# Patient Record
Sex: Female | Born: 1970 | Race: White | Hispanic: No | Marital: Married | Smoking: Former smoker
Health system: Southern US, Community
[De-identification: ages and names within clinical notes are randomized; demographics above are authoritative.]

## PROBLEM LIST (undated history)

## (undated) DIAGNOSIS — E039 Hypothyroidism, unspecified: Secondary | ICD-10-CM

## (undated) DIAGNOSIS — Z9889 Other specified postprocedural states: Secondary | ICD-10-CM

## (undated) DIAGNOSIS — R112 Nausea with vomiting, unspecified: Secondary | ICD-10-CM

## (undated) HISTORY — PX: OTHER SURGICAL HISTORY: SHX169

## (undated) HISTORY — PX: BREAST EXCISIONAL BIOPSY: SUR124

## (undated) HISTORY — PX: BREAST BIOPSY: SHX20

---

## 1997-09-05 ENCOUNTER — Other Ambulatory Visit: Admission: RE | Admit: 1997-09-05 | Discharge: 1997-09-05 | Payer: Self-pay | Admitting: Obstetrics and Gynecology

## 1998-03-21 ENCOUNTER — Inpatient Hospital Stay (HOSPITAL_COMMUNITY): Admission: AD | Admit: 1998-03-21 | Discharge: 1998-03-24 | Payer: Self-pay | Admitting: *Deleted

## 1998-05-01 ENCOUNTER — Other Ambulatory Visit: Admission: RE | Admit: 1998-05-01 | Discharge: 1998-05-01 | Payer: Self-pay | Admitting: Obstetrics and Gynecology

## 1999-04-26 ENCOUNTER — Other Ambulatory Visit: Admission: RE | Admit: 1999-04-26 | Discharge: 1999-04-26 | Payer: Self-pay | Admitting: Obstetrics and Gynecology

## 2003-10-05 ENCOUNTER — Other Ambulatory Visit: Admission: RE | Admit: 2003-10-05 | Discharge: 2003-10-05 | Payer: Self-pay | Admitting: Obstetrics and Gynecology

## 2004-10-17 ENCOUNTER — Other Ambulatory Visit: Admission: RE | Admit: 2004-10-17 | Discharge: 2004-10-17 | Payer: Self-pay | Admitting: Obstetrics and Gynecology

## 2004-11-25 ENCOUNTER — Other Ambulatory Visit: Admission: RE | Admit: 2004-11-25 | Discharge: 2004-11-25 | Payer: Self-pay | Admitting: Obstetrics and Gynecology

## 2005-01-28 ENCOUNTER — Other Ambulatory Visit: Admission: RE | Admit: 2005-01-28 | Discharge: 2005-01-28 | Payer: Self-pay | Admitting: Obstetrics and Gynecology

## 2005-08-06 ENCOUNTER — Other Ambulatory Visit: Admission: RE | Admit: 2005-08-06 | Discharge: 2005-08-06 | Payer: Self-pay | Admitting: Obstetrics and Gynecology

## 2005-11-13 ENCOUNTER — Other Ambulatory Visit: Admission: RE | Admit: 2005-11-13 | Discharge: 2005-11-13 | Payer: Self-pay | Admitting: Obstetrics and Gynecology

## 2006-12-23 ENCOUNTER — Other Ambulatory Visit: Admission: RE | Admit: 2006-12-23 | Discharge: 2006-12-23 | Payer: Self-pay | Admitting: Obstetrics and Gynecology

## 2007-12-27 ENCOUNTER — Other Ambulatory Visit: Admission: RE | Admit: 2007-12-27 | Discharge: 2007-12-27 | Payer: Self-pay | Admitting: Obstetrics and Gynecology

## 2008-12-27 ENCOUNTER — Other Ambulatory Visit: Admission: RE | Admit: 2008-12-27 | Discharge: 2008-12-27 | Payer: Self-pay | Admitting: Obstetrics and Gynecology

## 2009-12-03 ENCOUNTER — Other Ambulatory Visit: Admission: RE | Admit: 2009-12-03 | Discharge: 2009-12-03 | Payer: Self-pay | Admitting: Family Medicine

## 2010-03-11 ENCOUNTER — Ambulatory Visit (HOSPITAL_COMMUNITY)
Admission: RE | Admit: 2010-03-11 | Discharge: 2010-03-11 | Payer: Self-pay | Source: Home / Self Care | Attending: Family Medicine | Admitting: Family Medicine

## 2010-12-12 ENCOUNTER — Other Ambulatory Visit: Payer: Self-pay | Admitting: Family Medicine

## 2010-12-12 ENCOUNTER — Other Ambulatory Visit (HOSPITAL_COMMUNITY)
Admission: RE | Admit: 2010-12-12 | Discharge: 2010-12-12 | Disposition: A | Discharge status: Home / Self Care | Payer: BC Managed Care – PPO | Source: Ambulatory Visit | Attending: Family Medicine | Admitting: Family Medicine

## 2010-12-12 DIAGNOSIS — Z124 Encounter for screening for malignant neoplasm of cervix: Secondary | ICD-10-CM | POA: Insufficient documentation

## 2010-12-12 DIAGNOSIS — Z1159 Encounter for screening for other viral diseases: Secondary | ICD-10-CM | POA: Insufficient documentation

## 2011-03-18 ENCOUNTER — Other Ambulatory Visit (HOSPITAL_COMMUNITY): Payer: Self-pay | Admitting: Family Medicine

## 2011-03-18 DIAGNOSIS — Z1231 Encounter for screening mammogram for malignant neoplasm of breast: Secondary | ICD-10-CM

## 2011-03-20 ENCOUNTER — Ambulatory Visit (HOSPITAL_COMMUNITY)
Admission: RE | Admit: 2011-03-20 | Discharge: 2011-03-20 | Disposition: A | Discharge status: Home / Self Care | Payer: BC Managed Care – PPO | Source: Ambulatory Visit | Attending: Family Medicine | Admitting: Family Medicine

## 2011-03-20 DIAGNOSIS — Z1231 Encounter for screening mammogram for malignant neoplasm of breast: Secondary | ICD-10-CM

## 2012-03-16 ENCOUNTER — Other Ambulatory Visit (HOSPITAL_COMMUNITY): Payer: Self-pay

## 2012-03-16 DIAGNOSIS — Z1231 Encounter for screening mammogram for malignant neoplasm of breast: Secondary | ICD-10-CM

## 2012-03-25 ENCOUNTER — Ambulatory Visit (HOSPITAL_COMMUNITY)
Admission: RE | Admit: 2012-03-25 | Discharge: 2012-03-25 | Disposition: A | Discharge status: Home / Self Care | Payer: PRIVATE HEALTH INSURANCE | Source: Ambulatory Visit

## 2012-03-25 DIAGNOSIS — Z1231 Encounter for screening mammogram for malignant neoplasm of breast: Secondary | ICD-10-CM | POA: Insufficient documentation

## 2013-03-10 ENCOUNTER — Other Ambulatory Visit (HOSPITAL_COMMUNITY): Payer: Self-pay | Admitting: Family Medicine

## 2013-03-10 DIAGNOSIS — Z1231 Encounter for screening mammogram for malignant neoplasm of breast: Secondary | ICD-10-CM

## 2013-03-28 ENCOUNTER — Ambulatory Visit (HOSPITAL_COMMUNITY)
Admission: RE | Admit: 2013-03-28 | Discharge: 2013-03-28 | Disposition: A | Discharge status: Home / Self Care | Payer: PRIVATE HEALTH INSURANCE | Source: Ambulatory Visit | Attending: Family Medicine | Admitting: Family Medicine

## 2013-03-28 DIAGNOSIS — Z1231 Encounter for screening mammogram for malignant neoplasm of breast: Secondary | ICD-10-CM | POA: Insufficient documentation

## 2013-04-01 ENCOUNTER — Other Ambulatory Visit: Payer: Self-pay | Admitting: Family Medicine

## 2013-04-01 DIAGNOSIS — R928 Other abnormal and inconclusive findings on diagnostic imaging of breast: Secondary | ICD-10-CM

## 2013-04-12 ENCOUNTER — Ambulatory Visit
Admission: RE | Admit: 2013-04-12 | Discharge: 2013-04-12 | Disposition: A | Discharge status: Home / Self Care | Payer: PRIVATE HEALTH INSURANCE | Source: Ambulatory Visit | Attending: Family Medicine | Admitting: Family Medicine

## 2013-04-12 ENCOUNTER — Other Ambulatory Visit: Payer: Self-pay | Admitting: Family Medicine

## 2013-04-12 DIAGNOSIS — R928 Other abnormal and inconclusive findings on diagnostic imaging of breast: Secondary | ICD-10-CM

## 2013-09-12 ENCOUNTER — Other Ambulatory Visit: Payer: Self-pay | Admitting: Family Medicine

## 2013-09-12 DIAGNOSIS — R921 Mammographic calcification found on diagnostic imaging of breast: Secondary | ICD-10-CM

## 2013-10-12 ENCOUNTER — Encounter (INDEPENDENT_AMBULATORY_CARE_PROVIDER_SITE_OTHER): Payer: Self-pay

## 2013-10-12 ENCOUNTER — Ambulatory Visit
Admission: RE | Admit: 2013-10-12 | Discharge: 2013-10-12 | Disposition: A | Discharge status: Home / Self Care | Payer: PRIVATE HEALTH INSURANCE | Source: Ambulatory Visit | Attending: Family Medicine | Admitting: Family Medicine

## 2013-10-12 DIAGNOSIS — R921 Mammographic calcification found on diagnostic imaging of breast: Secondary | ICD-10-CM

## 2013-12-19 ENCOUNTER — Other Ambulatory Visit: Payer: Self-pay | Admitting: Family Medicine

## 2013-12-19 ENCOUNTER — Other Ambulatory Visit (HOSPITAL_COMMUNITY)
Admission: RE | Admit: 2013-12-19 | Discharge: 2013-12-19 | Disposition: A | Discharge status: Home / Self Care | Payer: PRIVATE HEALTH INSURANCE | Source: Ambulatory Visit | Attending: Family Medicine | Admitting: Family Medicine

## 2013-12-19 DIAGNOSIS — Z113 Encounter for screening for infections with a predominantly sexual mode of transmission: Secondary | ICD-10-CM | POA: Diagnosis present

## 2013-12-19 DIAGNOSIS — Z01411 Encounter for gynecological examination (general) (routine) with abnormal findings: Secondary | ICD-10-CM | POA: Diagnosis present

## 2013-12-20 LAB — CYTOLOGY - PAP

## 2014-03-08 ENCOUNTER — Other Ambulatory Visit: Payer: Self-pay | Admitting: Family Medicine

## 2014-03-08 DIAGNOSIS — R921 Mammographic calcification found on diagnostic imaging of breast: Secondary | ICD-10-CM

## 2014-03-17 ENCOUNTER — Ambulatory Visit
Admission: RE | Admit: 2014-03-17 | Discharge: 2014-03-17 | Disposition: A | Discharge status: Home / Self Care | Payer: PRIVATE HEALTH INSURANCE | Source: Ambulatory Visit | Attending: Family Medicine | Admitting: Family Medicine

## 2014-03-17 DIAGNOSIS — R921 Mammographic calcification found on diagnostic imaging of breast: Secondary | ICD-10-CM

## 2015-03-07 ENCOUNTER — Other Ambulatory Visit: Payer: Self-pay | Admitting: Family Medicine

## 2015-03-07 DIAGNOSIS — R921 Mammographic calcification found on diagnostic imaging of breast: Secondary | ICD-10-CM

## 2015-03-20 ENCOUNTER — Other Ambulatory Visit: Payer: Self-pay | Admitting: Family Medicine

## 2015-03-20 ENCOUNTER — Ambulatory Visit
Admission: RE | Admit: 2015-03-20 | Discharge: 2015-03-20 | Disposition: A | Discharge status: Home / Self Care | Payer: No Typology Code available for payment source | Source: Ambulatory Visit | Attending: Family Medicine | Admitting: Family Medicine

## 2015-03-20 DIAGNOSIS — R921 Mammographic calcification found on diagnostic imaging of breast: Secondary | ICD-10-CM

## 2016-03-06 ENCOUNTER — Other Ambulatory Visit: Payer: Self-pay | Admitting: Family Medicine

## 2016-03-06 DIAGNOSIS — R921 Mammographic calcification found on diagnostic imaging of breast: Secondary | ICD-10-CM

## 2016-03-28 ENCOUNTER — Ambulatory Visit
Admission: RE | Admit: 2016-03-28 | Discharge: 2016-03-28 | Disposition: A | Discharge status: Home / Self Care | Payer: No Typology Code available for payment source | Source: Ambulatory Visit | Attending: Family Medicine | Admitting: Family Medicine

## 2016-03-28 ENCOUNTER — Other Ambulatory Visit: Payer: Self-pay | Admitting: Family Medicine

## 2016-03-28 DIAGNOSIS — R921 Mammographic calcification found on diagnostic imaging of breast: Secondary | ICD-10-CM

## 2016-04-01 ENCOUNTER — Ambulatory Visit
Admission: RE | Admit: 2016-04-01 | Discharge: 2016-04-01 | Disposition: A | Discharge status: Home / Self Care | Payer: No Typology Code available for payment source | Source: Ambulatory Visit | Attending: Family Medicine | Admitting: Family Medicine

## 2016-04-01 DIAGNOSIS — R921 Mammographic calcification found on diagnostic imaging of breast: Secondary | ICD-10-CM

## 2016-04-08 ENCOUNTER — Other Ambulatory Visit: Payer: Self-pay | Admitting: General Surgery

## 2016-04-08 DIAGNOSIS — N6092 Unspecified benign mammary dysplasia of left breast: Secondary | ICD-10-CM

## 2016-04-25 ENCOUNTER — Other Ambulatory Visit: Payer: Self-pay | Admitting: General Surgery

## 2016-04-25 DIAGNOSIS — N6092 Unspecified benign mammary dysplasia of left breast: Secondary | ICD-10-CM

## 2016-04-28 ENCOUNTER — Encounter (HOSPITAL_BASED_OUTPATIENT_CLINIC_OR_DEPARTMENT_OTHER): Payer: Self-pay | Admitting: *Deleted

## 2016-04-28 NOTE — Progress Notes (Signed)
Bring all medications day of surgery. Coming to pick up Boost Monday.

## 2016-05-05 ENCOUNTER — Ambulatory Visit
Admission: RE | Admit: 2016-05-05 | Discharge: 2016-05-05 | Disposition: A | Discharge status: Home / Self Care | Payer: No Typology Code available for payment source | Source: Ambulatory Visit | Attending: General Surgery | Admitting: General Surgery

## 2016-05-05 DIAGNOSIS — N6092 Unspecified benign mammary dysplasia of left breast: Secondary | ICD-10-CM

## 2016-05-05 NOTE — Progress Notes (Signed)
Boost drink given with instructions to complete by 0715/ pt verbalized understanding.

## 2016-05-06 ENCOUNTER — Ambulatory Visit (HOSPITAL_BASED_OUTPATIENT_CLINIC_OR_DEPARTMENT_OTHER): Payer: No Typology Code available for payment source | Admitting: Anesthesiology

## 2016-05-06 ENCOUNTER — Encounter (HOSPITAL_BASED_OUTPATIENT_CLINIC_OR_DEPARTMENT_OTHER): Payer: Self-pay | Admitting: Anesthesiology

## 2016-05-06 ENCOUNTER — Encounter (HOSPITAL_BASED_OUTPATIENT_CLINIC_OR_DEPARTMENT_OTHER)
Admission: RE | Disposition: A | Discharge status: Home / Self Care | Payer: Self-pay | Source: Ambulatory Visit | Attending: General Surgery

## 2016-05-06 ENCOUNTER — Ambulatory Visit (HOSPITAL_BASED_OUTPATIENT_CLINIC_OR_DEPARTMENT_OTHER)
Admission: RE | Admit: 2016-05-06 | Discharge: 2016-05-06 | Disposition: A | Discharge status: Home / Self Care | Payer: No Typology Code available for payment source | Source: Ambulatory Visit | Attending: General Surgery | Admitting: General Surgery

## 2016-05-06 ENCOUNTER — Ambulatory Visit
Admission: RE | Admit: 2016-05-06 | Discharge: 2016-05-06 | Disposition: A | Discharge status: Home / Self Care | Payer: No Typology Code available for payment source | Source: Ambulatory Visit | Attending: General Surgery | Admitting: General Surgery

## 2016-05-06 DIAGNOSIS — N6092 Unspecified benign mammary dysplasia of left breast: Secondary | ICD-10-CM

## 2016-05-06 DIAGNOSIS — Z87891 Personal history of nicotine dependence: Secondary | ICD-10-CM | POA: Insufficient documentation

## 2016-05-06 DIAGNOSIS — Z79899 Other long term (current) drug therapy: Secondary | ICD-10-CM | POA: Insufficient documentation

## 2016-05-06 HISTORY — DX: Other specified postprocedural states: Z98.890

## 2016-05-06 HISTORY — PX: RADIOACTIVE SEED GUIDED EXCISIONAL BREAST BIOPSY: SHX6490

## 2016-05-06 HISTORY — DX: Other specified postprocedural states: R11.2

## 2016-05-06 HISTORY — DX: Hypothyroidism, unspecified: E03.9

## 2016-05-06 SURGERY — RADIOACTIVE SEED GUIDED BREAST BIOPSY
Anesthesia: General | Site: Breast | Laterality: Left

## 2016-05-06 MED ORDER — SCOPOLAMINE 1 MG/3DAYS TD PT72
1.0000 | MEDICATED_PATCH | Freq: Once | TRANSDERMAL | Status: DC | PRN
  Administered 2016-05-06: 1.5 mg via TRANSDERMAL

## 2016-05-06 MED ORDER — EPHEDRINE 5 MG/ML INJ
INTRAVENOUS | Status: AC
  Filled 2016-05-06: qty 10

## 2016-05-06 MED ORDER — PHENYLEPHRINE HCL 10 MG/ML IJ SOLN
INTRAMUSCULAR | Status: DC | PRN
  Administered 2016-05-06 (×6): 80 ug via INTRAVENOUS

## 2016-05-06 MED ORDER — CEFAZOLIN SODIUM-DEXTROSE 2-4 GM/100ML-% IV SOLN
2.0000 g | INTRAVENOUS | Status: AC
  Administered 2016-05-06: 2 g via INTRAVENOUS

## 2016-05-06 MED ORDER — ACETAMINOPHEN 500 MG PO TABS
ORAL_TABLET | ORAL | Status: AC
  Filled 2016-05-06: qty 2

## 2016-05-06 MED ORDER — LIDOCAINE 2% (20 MG/ML) 5 ML SYRINGE
INTRAMUSCULAR | Status: DC | PRN
  Administered 2016-05-06: 100 mg via INTRAVENOUS

## 2016-05-06 MED ORDER — HYDROCODONE-ACETAMINOPHEN 10-325 MG PO TABS: 1.0000 | ORAL_TABLET | Freq: Four times a day (QID) | ORAL | 0 refills | Status: DC | PRN

## 2016-05-06 MED ORDER — ONDANSETRON HCL 4 MG/2ML IJ SOLN
INTRAMUSCULAR | Status: AC
  Filled 2016-05-06: qty 2

## 2016-05-06 MED ORDER — PHENYLEPHRINE 40 MCG/ML (10ML) SYRINGE FOR IV PUSH (FOR BLOOD PRESSURE SUPPORT)
PREFILLED_SYRINGE | INTRAVENOUS | Status: AC
  Filled 2016-05-06: qty 10

## 2016-05-06 MED ORDER — MIDAZOLAM HCL 2 MG/2ML IJ SOLN
INTRAMUSCULAR | Status: AC
  Filled 2016-05-06: qty 2

## 2016-05-06 MED ORDER — MIDAZOLAM HCL 2 MG/2ML IJ SOLN
1.0000 mg | INTRAMUSCULAR | Status: DC | PRN
  Administered 2016-05-06: 2 mg via INTRAVENOUS

## 2016-05-06 MED ORDER — LIDOCAINE 2% (20 MG/ML) 5 ML SYRINGE
INTRAMUSCULAR | Status: AC
  Filled 2016-05-06: qty 5

## 2016-05-06 MED ORDER — FENTANYL CITRATE (PF) 100 MCG/2ML IJ SOLN
50.0000 ug | INTRAMUSCULAR | Status: DC | PRN
  Administered 2016-05-06: 100 ug via INTRAVENOUS

## 2016-05-06 MED ORDER — GABAPENTIN 300 MG PO CAPS
300.0000 mg | ORAL_CAPSULE | ORAL | Status: AC
  Administered 2016-05-06: 300 mg via ORAL

## 2016-05-06 MED ORDER — ONDANSETRON HCL 4 MG/2ML IJ SOLN
INTRAMUSCULAR | Status: DC | PRN
  Administered 2016-05-06: 4 mg via INTRAVENOUS

## 2016-05-06 MED ORDER — BUPIVACAINE HCL (PF) 0.25 % IJ SOLN
INTRAMUSCULAR | Status: DC | PRN
  Administered 2016-05-06: 6 mL

## 2016-05-06 MED ORDER — GLYCOPYRROLATE 0.2 MG/ML IJ SOLN
INTRAMUSCULAR | Status: DC | PRN
  Administered 2016-05-06: 0.2 mg via INTRAVENOUS

## 2016-05-06 MED ORDER — GABAPENTIN 300 MG PO CAPS
ORAL_CAPSULE | ORAL | Status: AC
  Filled 2016-05-06: qty 1

## 2016-05-06 MED ORDER — SCOPOLAMINE 1 MG/3DAYS TD PT72
MEDICATED_PATCH | TRANSDERMAL | Status: AC
  Filled 2016-05-06: qty 1

## 2016-05-06 MED ORDER — CELECOXIB 200 MG PO CAPS
200.0000 mg | ORAL_CAPSULE | ORAL | Status: AC
  Administered 2016-05-06: 200 mg via ORAL

## 2016-05-06 MED ORDER — PROPOFOL 10 MG/ML IV BOLUS
INTRAVENOUS | Status: AC
  Filled 2016-05-06: qty 20

## 2016-05-06 MED ORDER — DEXAMETHASONE SODIUM PHOSPHATE 10 MG/ML IJ SOLN
INTRAMUSCULAR | Status: AC
  Filled 2016-05-06: qty 1

## 2016-05-06 MED ORDER — FENTANYL CITRATE (PF) 100 MCG/2ML IJ SOLN
INTRAMUSCULAR | Status: AC
  Filled 2016-05-06: qty 2

## 2016-05-06 MED ORDER — LACTATED RINGERS IV SOLN
INTRAVENOUS | Status: DC
  Administered 2016-05-06 (×2): via INTRAVENOUS

## 2016-05-06 MED ORDER — DEXAMETHASONE SODIUM PHOSPHATE 4 MG/ML IJ SOLN
INTRAMUSCULAR | Status: DC | PRN
  Administered 2016-05-06: 10 mg via INTRAVENOUS

## 2016-05-06 MED ORDER — CEFAZOLIN SODIUM-DEXTROSE 2-4 GM/100ML-% IV SOLN
INTRAVENOUS | Status: AC
  Filled 2016-05-06: qty 100

## 2016-05-06 MED ORDER — PROPOFOL 10 MG/ML IV BOLUS
INTRAVENOUS | Status: DC | PRN
  Administered 2016-05-06: 200 mg via INTRAVENOUS

## 2016-05-06 MED ORDER — ACETAMINOPHEN 500 MG PO TABS
1000.0000 mg | ORAL_TABLET | ORAL | Status: AC
  Administered 2016-05-06: 1000 mg via ORAL

## 2016-05-06 MED ORDER — CELECOXIB 200 MG PO CAPS
ORAL_CAPSULE | ORAL | Status: AC
  Filled 2016-05-06: qty 1

## 2016-05-06 SURGICAL SUPPLY — 57 items
ADH SKN CLS APL DERMABOND .7 (GAUZE/BANDAGES/DRESSINGS) ×1
BINDER BREAST LRG (GAUZE/BANDAGES/DRESSINGS) IMPLANT
BINDER BREAST MEDIUM (GAUZE/BANDAGES/DRESSINGS) ×1 IMPLANT
BINDER BREAST XLRG (GAUZE/BANDAGES/DRESSINGS) IMPLANT
BINDER BREAST XXLRG (GAUZE/BANDAGES/DRESSINGS) IMPLANT
BLADE SURG 15 STRL LF DISP TIS (BLADE) ×1 IMPLANT
BLADE SURG 15 STRL SS (BLADE) ×2
CANISTER SUC SOCK COL 7IN (MISCELLANEOUS) IMPLANT
CANISTER SUCT 1200ML W/VALVE (MISCELLANEOUS) IMPLANT
CHLORAPREP W/TINT 26ML (MISCELLANEOUS) ×2 IMPLANT
CLIP TI WIDE RED SMALL 6 (CLIP) IMPLANT
COVER BACK TABLE 60X90IN (DRAPES) ×2 IMPLANT
COVER MAYO STAND STRL (DRAPES) ×2 IMPLANT
COVER PROBE W GEL 5X96 (DRAPES) ×2 IMPLANT
DECANTER SPIKE VIAL GLASS SM (MISCELLANEOUS) IMPLANT
DERMABOND ADVANCED (GAUZE/BANDAGES/DRESSINGS) ×1
DERMABOND ADVANCED .7 DNX12 (GAUZE/BANDAGES/DRESSINGS) ×1 IMPLANT
DEVICE DUBIN W/COMP PLATE 8390 (MISCELLANEOUS) ×2 IMPLANT
DRAPE LAPAROSCOPIC ABDOMINAL (DRAPES) ×2 IMPLANT
DRAPE UTILITY XL STRL (DRAPES) ×2 IMPLANT
DRSG TEGADERM 4X4.75 (GAUZE/BANDAGES/DRESSINGS) IMPLANT
ELECT COATED BLADE 2.86 ST (ELECTRODE) ×2 IMPLANT
ELECT REM PT RETURN 9FT ADLT (ELECTROSURGICAL) ×2
ELECTRODE REM PT RTRN 9FT ADLT (ELECTROSURGICAL) ×1 IMPLANT
GLOVE BIO SURGEON STRL SZ7 (GLOVE) ×5 IMPLANT
GLOVE BIOGEL PI IND STRL 7.0 (GLOVE) IMPLANT
GLOVE BIOGEL PI IND STRL 7.5 (GLOVE) ×1 IMPLANT
GLOVE BIOGEL PI INDICATOR 7.0 (GLOVE) ×1
GLOVE BIOGEL PI INDICATOR 7.5 (GLOVE) ×2
GOWN STRL REUS W/ TWL LRG LVL3 (GOWN DISPOSABLE) ×2 IMPLANT
GOWN STRL REUS W/TWL LRG LVL3 (GOWN DISPOSABLE) ×4
HEMOSTAT ARISTA ABSORB 3G PWDR (MISCELLANEOUS) IMPLANT
ILLUMINATOR WAVEGUIDE N/F (MISCELLANEOUS) IMPLANT
KIT MARKER MARGIN INK (KITS) ×2 IMPLANT
LIGHT WAVEGUIDE WIDE FLAT (MISCELLANEOUS) IMPLANT
NDL HYPO 25X1 1.5 SAFETY (NEEDLE) ×1 IMPLANT
NEEDLE HYPO 25X1 1.5 SAFETY (NEEDLE) ×2 IMPLANT
NS IRRIG 1000ML POUR BTL (IV SOLUTION) IMPLANT
PACK BASIN DAY SURGERY FS (CUSTOM PROCEDURE TRAY) ×2 IMPLANT
PENCIL BUTTON HOLSTER BLD 10FT (ELECTRODE) ×2 IMPLANT
SLEEVE SCD COMPRESS KNEE MED (MISCELLANEOUS) ×2 IMPLANT
SPONGE GAUZE 4X4 12PLY STER LF (GAUZE/BANDAGES/DRESSINGS) IMPLANT
SPONGE LAP 4X18 X RAY DECT (DISPOSABLE) ×2 IMPLANT
STRIP CLOSURE SKIN 1/2X4 (GAUZE/BANDAGES/DRESSINGS) ×2 IMPLANT
SUT MNCRL AB 4-0 PS2 18 (SUTURE) IMPLANT
SUT MON AB 5-0 PS2 18 (SUTURE) ×1 IMPLANT
SUT SILK 2 0 SH (SUTURE) IMPLANT
SUT VIC AB 2-0 SH 27 (SUTURE) ×2
SUT VIC AB 2-0 SH 27XBRD (SUTURE) ×1 IMPLANT
SUT VIC AB 3-0 SH 27 (SUTURE) ×2
SUT VIC AB 3-0 SH 27X BRD (SUTURE) ×1 IMPLANT
SUT VIC AB 5-0 PS2 18 (SUTURE) IMPLANT
SYR CONTROL 10ML LL (SYRINGE) ×2 IMPLANT
TOWEL OR 17X24 6PK STRL BLUE (TOWEL DISPOSABLE) ×2 IMPLANT
TOWEL OR NON WOVEN STRL DISP B (DISPOSABLE) ×1 IMPLANT
TUBE CONNECTING 20X1/4 (TUBING) IMPLANT
YANKAUER SUCT BULB TIP NO VENT (SUCTIONS) IMPLANT

## 2016-05-06 NOTE — H&P (Signed)
  Donna Patel referred by Dr Farris HasAaron Morrow for left breat calcs on screening mm. she has no personal history or family history she did not have mass or dc. mm shows c density breasts. there is a group of coarse calcs in the luoq. this underwent biopsy that shows ALH. she is here to discuss options.   Past Surgical History  Cesarean Section - Multiple   Diagnostic Studies History  Colonoscopy  never Mammogram  within last year Pap Smear  1-5 years ago  Allergies  No Known Drug Allergies   Medication History Levothyroxine Sodium (100MCG Tablet, Oral) Active. Medications Reconciled  Social History  Alcohol use  Occasional alcohol use. Caffeine use  Coffee. No drug use  Tobacco use  Former smoker.  Family History  Diabetes Mellitus  Brother.  Pregnancy / Birth History Age at menarche  11 years. Contraceptive History  Oral contraceptives. Gravida  2 Length (months) of breastfeeding  3-6 Maternal age  47-30 Para  2 Regular periods   Other Problems  Thyroid Disease  Umbilical Hernia Repair   Review of Systems General Not Present- Appetite Loss, Chills, Fatigue, Fever, Night Sweats, Weight Gain and Weight Loss. Skin Not Present- Change in Wart/Mole, Dryness, Hives, Jaundice, New Lesions, Non-Healing Wounds, Rash and Ulcer. HEENT Present- Wears glasses/contact lenses. Not Present- Earache, Hearing Loss, Hoarseness, Nose Bleed, Oral Ulcers, Ringing in the Ears, Seasonal Allergies, Sinus Pain, Sore Throat, Visual Disturbances and Yellow Eyes. Respiratory Not Present- Bloody sputum, Chronic Cough, Difficulty Breathing, Snoring and Wheezing. Breast Not Present- Breast Mass, Breast Pain, Nipple Discharge and Skin Changes. Cardiovascular Not Present- Chest Pain, Difficulty Breathing Lying Down, Leg Cramps, Palpitations, Rapid Heart Rate, Shortness of Breath and Swelling of Extremities. Gastrointestinal Not Present- Abdominal Pain, Bloating, Bloody Stool, Change in  Bowel Habits, Chronic diarrhea, Constipation, Difficulty Swallowing, Excessive gas, Gets full quickly at meals, Hemorrhoids, Indigestion, Nausea, Rectal Pain and Vomiting. Female Genitourinary Not Present- Frequency, Nocturia, Painful Urination, Pelvic Pain and Urgency. Neurological Not Present- Decreased Memory, Fainting, Headaches, Numbness, Seizures, Tingling, Tremor, Trouble walking and Weakness. Endocrine Not Present- Cold Intolerance, Excessive Hunger, Hair Changes, Heat Intolerance, Hot flashes and New Diabetes. Hematology Not Present- Blood Thinners, Easy Bruising, Excessive bleeding, Gland problems, HIV and Persistent Infections.  Vitals  Weight: 112.13 lb Height: 60in Body Surface Area: 1.46 m Body Mass Index: 21.9 kg/m  Pulse: 72 (Regular)  BP: 126/78 (Sitting, Left Arm, Standard) Physical Exam General Mental Status-Alert. Orientation-Oriented X3. Chest and Lung Exam Chest and lung exam reveals -on auscultation, normal breath sounds, no adventitious sounds and normal vocal resonance. Breast Nipples-No Discharge. Breast Lump-No Palpable Breast Mass. Cardiovascular Cardiovascular examination reveals -normal heart sounds, regular rate and rhythm with no murmurs. Lymphatic Head & Neck General Head & Neck Lymphatics: Bilateral - Description - Normal. Axillary General Axillary Region: Bilateral - Description - Normal. 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 discussed rationale for excision given possibility of upgrade that might change way she is followed, chemoprevention or even be therapeutic. we discussed seed guided excision that would wait a couple weeks to have her hematoma resolve. discussed risks and recovery

## 2016-05-06 NOTE — Anesthesia Procedure Notes (Signed)
Procedure Name: LMA Insertion Date/Time: 05/06/2016 11:50 AM Performed by: Caren MacadamARTER, APRIL W Pre-anesthesia Checklist: Patient identified, Emergency Drugs available, Suction available and Patient being monitored Patient Re-evaluated:Patient Re-evaluated prior to inductionOxygen Delivery Method: Circle system utilized Preoxygenation: Pre-oxygenation with 100% oxygen Intubation Type: IV induction Ventilation: Mask ventilation without difficulty LMA: LMA inserted LMA Size: 3.0 Number of attempts: 1 Airway Equipment and Method: Bite block Placement Confirmation: positive ETCO2 and breath sounds checked- equal and bilateral Tube secured with: Tape Dental Injury: Teeth and Oropharynx as per pre-operative assessment

## 2016-05-06 NOTE — Op Note (Signed)
Preoperative diagnoses: left breast calcifications with core biopsy c/w alh Postoperative diagnosis: Same as above Procedure:Leftbreast seed guided excisional biopsy Surgeon: Dr. Harden MoMatt Deshana Rominger Anesthesia: Gen. Estimated blood loss: minimal Complications: None Drains: None Specimens:Leftbreast tissue with paint Sponge and needle count correct at completion Disposition to recovery stable  Indications: This is a [age] yof with left sided calcifications with core biospy with alh. We discussed excisional biopsy using seed guidance. She had seed placed prior to beginning and the mammogramswere in the room.   Procedure: After informed consent was obtained she was then taken to the operating room. She was given cefazolin. Sequential compression devices were on her legs. She was placed under general anesthesia without complication. Her left breast was then prepped and draped in the standard sterile surgical fashion. A surgical timeout was then performed.  I located the radioactive seed with the neoprobe. I infiltrated marcaine in the area of the seed. I made a periareolar incision to hide the scar. I then used the neoprobe to guide the excision of the seed and surrounding tissue.This was confirmed by the neoprobe. This was then taken for mammogram which confirmed removal of the seed and the clip. This was confirmed by radiology. This was then sent to pathology. Hemostasis was observed.I closed the breast tissue with a 2-0 Vicryl. The dermis was closed with 3-0 Vicryl and the skin with 5-0 Monocryl.Dermabond and steristrips were placed on the incision. A breast binder was placed. She was transferred to recovery stable

## 2016-05-06 NOTE — Anesthesia Postprocedure Evaluation (Signed)
Anesthesia Post Note  Patient: Donna Patel  Procedure(s) Performed: Procedure(s) (LRB): LEFT BREAST RADIOACTIVE SEED GUIDED EXCISIONAL BIOPSY (Left)  Patient location during evaluation: PACU Anesthesia Type: General Level of consciousness: sedated and patient cooperative Pain management: pain level controlled Vital Signs Assessment: post-procedure vital signs reviewed and stable Respiratory status: spontaneous breathing Cardiovascular status: stable Anesthetic complications: no       Last Vitals:  Vitals:   05/06/16 1315 05/06/16 1338  BP: 117/80 132/74  Pulse: 74 70  Resp: 14 18  Temp:  36.8 C    Last Pain:  Vitals:   05/06/16 1338  TempSrc:   PainSc: 2                  Lewie LoronJohn Moody Robben

## 2016-05-06 NOTE — Transfer of Care (Signed)
Immediate Anesthesia Transfer of Care Note  Patient: Donna Patel  Procedure(s) Performed: Procedure(s): LEFT BREAST RADIOACTIVE SEED GUIDED EXCISIONAL BIOPSY (Left)  Patient Location: PACU  Anesthesia Type:General  Level of Consciousness: sedated  Airway & Oxygen Therapy: Patient Spontanous Breathing and Patient connected to face mask oxygen  Post-op Assessment: Report given to RN and Post -op Vital signs reviewed and stable  Post vital signs: Reviewed and stable  Last Vitals:  Vitals:   05/06/16 1240 05/06/16 1241  BP: (!) 89/59   Pulse: 69 68  Resp: 16 11  Temp:      Last Pain:  Vitals:   05/06/16 1001  TempSrc: Oral         Complications: No apparent anesthesia complications

## 2016-05-06 NOTE — Discharge Instructions (Signed)
[facility] °Office Phone Number [phone] ° °POST OP INSTRUCTIONS ° °Always review your discharge instruction sheet given to you by the facility where your surgery was performed. ° °IF YOU HAVE DISABILITY OR FAMILY LEAVE FORMS, YOU MUST BRING THEM TO THE OFFICE FOR PROCESSING.  DO NOT GIVE THEM TO YOUR DOCTOR. ° °1. A prescription for pain medication may be given to you upon discharge.  Take your pain medication as prescribed, if needed.  If narcotic pain medicine is not needed, then you may take acetaminophen (Tylenol), naprosyn (Alleve) or ibuprofen (Advil) as needed. °2. Take your usually prescribed medications unless otherwise directed °3. If you need a refill on your pain medication, please contact your pharmacy.  They will contact our office to request authorization.  Prescriptions will not be filled after 5pm or on week-ends. °4. You should eat very light the first 24 hours after surgery, such as soup, crackers, pudding, etc.  Resume your normal diet the day after surgery. °5. Most patients will experience some swelling and bruising in the breast.  Ice packs and a good support bra will help.  Wear the breast binder provided or a sports bra for 72 hours day and night.  After that wear a sports bra during the day until you return to the office. Swelling and bruising can take several days to resolve.  °6. It is common to experience some constipation if taking pain medication after surgery.  Increasing fluid intake and taking a stool softener will usually help or prevent this problem from occurring.  A mild laxative (Milk of Magnesia or Miralax) should be taken according to package directions if there are no bowel movements after 48 hours. °7. Unless discharge instructions indicate otherwise, you may remove your bandages 48 hours after surgery and you may shower at that time.  You may have steri-strips (small skin tapes) in place directly over the incision.  These strips should be left on the  skin for 7-10 days and will come off on their own.  If your surgeon used skin glue on the incision, you may shower in 24 hours.  The glue will flake off over the next 2-3 weeks.  Any sutures or staples will be removed at the office during your follow-up visit. °8. ACTIVITIES:  You may resume regular daily activities (gradually increasing) beginning the next day.  Wearing a good support bra or sports bra minimizes pain and swelling.  You may have sexual intercourse when it is comfortable. °a. You may drive when you no longer are taking prescription pain medication, you can comfortably wear a seatbelt, and you can safely maneuver your car and apply brakes. °b. RETURN TO WORK:  ______________________________________________________________________________________ °9. You should see your doctor in the office for a follow-up appointment approximately two weeks after your surgery.  Your doctor’s nurse will typically make your follow-up appointment when she calls you with your pathology report.  Expect your pathology report 3-4 business days after your surgery.  You may call to check if you do not hear from us after three days. °10. OTHER INSTRUCTIONS: _______________________________________________________________________________________________ _____________________________________________________________________________________________________________________________________ °_____________________________________________________________________________________________________________________________________ °_____________________________________________________________________________________________________________________________________ ° °WHEN TO CALL DR WAKEFIELD: °1. Fever over 101.0 °2. Nausea and/or vomiting. °3. Extreme swelling or bruising. °4. Continued bleeding from incision. °5. Increased pain, redness, or drainage from the incision. ° °The clinic staff is available to answer your questions during regular  business hours.  Please don’t hesitate to call and ask to speak to one of the nurses for clinical concerns.  If   you have a medical emergency, go to the nearest emergency room or call 911.  A surgeon from [facility] is always on call at the hospital. ° °For further questions, please visit [email or web address] ° ° ° °Post Anesthesia Home Care Instructions ° °Activity: °Get plenty of rest for the remainder of the day. A responsible adult should stay with you for 24 hours following the procedure.  °For the next 24 hours, DO NOT: °-Drive a car °-Operate machinery °-Drink alcoholic beverages °-Take any medication unless instructed by your physician °-Make any legal decisions or sign important papers. ° °Meals: °Start with liquid foods such as gelatin or soup. Progress to regular foods as tolerated. Avoid greasy, spicy, heavy foods. If nausea and/or vomiting occur, drink only clear liquids until the nausea and/or vomiting subsides. Call your physician if vomiting continues. ° °Special Instructions/Symptoms: °Your throat may feel dry or sore from the anesthesia or the breathing tube placed in your throat during surgery. If this causes discomfort, gargle with warm salt water. The discomfort should disappear within 24 hours. ° °If you had a scopolamine patch placed behind your ear for the management of post- operative nausea and/or vomiting: ° °1. The medication in the patch is effective for 72 hours, after which it should be removed.  Wrap patch in a tissue and discard in the trash. Wash hands thoroughly with soap and water. °2. You may remove the patch earlier than 72 hours if you experience unpleasant side effects which may include dry mouth, dizziness or visual disturbances. °3. Avoid touching the patch. Wash your hands with soap and water after contact with the patch. °  ° °

## 2016-05-06 NOTE — Anesthesia Preprocedure Evaluation (Signed)
Anesthesia Evaluation  Patient identified by MRN, date of birth, ID band Patient awake    Reviewed: Allergy & Precautions, NPO status , Patient's Chart, lab work & pertinent test results  History of Anesthesia Complications (+) PONV and history of anesthetic complications  Airway Mallampati: II  TM Distance: >3 FB Neck ROM: Full    Dental no notable dental hx.    Pulmonary former smoker,    Pulmonary exam normal breath sounds clear to auscultation       Cardiovascular negative cardio ROS Normal cardiovascular exam Rhythm:Regular Rate:Normal     Neuro/Psych negative neurological ROS  negative psych ROS   GI/Hepatic negative GI ROS, Neg liver ROS,   Endo/Other  negative endocrine ROS  Renal/GU negative Renal ROS     Musculoskeletal negative musculoskeletal ROS (+)   Abdominal   Peds  Hematology negative hematology ROS (+)   Anesthesia Other Findings   Reproductive/Obstetrics                             Anesthesia Physical Anesthesia Plan  ASA: II  Anesthesia Plan: General   Post-op Pain Management:    Induction: Intravenous  Airway Management Planned: LMA  Additional Equipment:   Intra-op Plan:   Post-operative Plan: Extubation in OR  Informed Consent: I have reviewed the patients History and Physical, chart, labs and discussed the procedure including the risks, benefits and alternatives for the proposed anesthesia with the patient or authorized representative who has indicated his/her understanding and acceptance.   Dental advisory given  Plan Discussed with: CRNA  Anesthesia Plan Comments:         Anesthesia Quick Evaluation

## 2016-05-07 ENCOUNTER — Encounter (HOSPITAL_BASED_OUTPATIENT_CLINIC_OR_DEPARTMENT_OTHER): Payer: Self-pay | Admitting: General Surgery

## 2016-05-08 ENCOUNTER — Other Ambulatory Visit: Payer: Self-pay | Admitting: Family Medicine

## 2016-06-11 ENCOUNTER — Encounter: Payer: Self-pay | Admitting: Oncology

## 2016-06-11 ENCOUNTER — Telehealth: Payer: Self-pay | Admitting: Oncology

## 2016-06-11 NOTE — Telephone Encounter (Signed)
New high risk pt, returned my call to schedule an appt. Appt has been scheduled for the pt to see Dr. Darnelle Catalan on 4/30 at 4pm. Demographics verified. Pt aware to arrive 30 minutes early.

## 2016-06-30 ENCOUNTER — Ambulatory Visit (HOSPITAL_BASED_OUTPATIENT_CLINIC_OR_DEPARTMENT_OTHER): Payer: No Typology Code available for payment source | Admitting: Oncology

## 2016-06-30 ENCOUNTER — Other Ambulatory Visit: Payer: Self-pay | Admitting: *Deleted

## 2016-06-30 DIAGNOSIS — E039 Hypothyroidism, unspecified: Secondary | ICD-10-CM | POA: Diagnosis not present

## 2016-06-30 DIAGNOSIS — N6092 Unspecified benign mammary dysplasia of left breast: Secondary | ICD-10-CM | POA: Diagnosis not present

## 2016-06-30 NOTE — Progress Notes (Signed)
[facility]  Telephone:([phone] Fax:([phone]) [phone]     ID: Donna Patel DOB: 22-Feb-1971  MR#: [id number]  JYN#:[id number]  Patient Care Team: Farris Has, MD as PCP - General (Family Medicine) Lowella Dell, MD as Consulting Physician (Oncology) Emelia Loron, MD as Consulting Physician (General Surgery) Cherlyn Roberts, MD as Consulting Physician (Dermatology) Lowella Dell, MD OTHER MD:  CHIEF COMPLAINT: Atypical ductal hyperplasia, atypical lobular hyperplasia  CURRENT TREATMENT: Observation   HISTORY OF PRESENT ILLNESS The patient had bilateral screening mammography 03/31/2013, with breast density category D. There were some calcifications in the left breast, which were further evaluated 04/12/2013, 10/12/2013, and 03/17/2014. As no change was noted follow-up was switched back to yearly and on 03/20/2015 the calcifications were stable in extent although possibly increased in number.  On 04/28/2016 bilateral diagnostic mammography with tomography found a breast density to be category C. There was a 2.4 cm area of concern with coarse and pleomorphic calcifications in the upper outer quadrant of the left breast. Biopsy of this area 04/01/2016 showed ([id number]) atypical lobular hyperplasia/lobular neoplasia. Accordingly the patient was referred to surgery and after appropriate discussion she underwent left lumpectomy on 05/06/2016. The final pathology from that procedure (SCM [id number]) showed atypical ductal hyperplasia and atypical lobular hyperplasia.  The patient's subsequent history is as detailed below  INTERVAL HISTORY: She was evaluated in the breast clinic 06/30/2016 accompanied by her husband Donna Patel  REVIEW OF SYSTEMS: There were no specific symptoms leading to the original mammogram, which was routinely scheduled. The patient denies unusual headaches, visual changes, nausea, vomiting, stiff neck, dizziness, or gait imbalance. There has been  no cough, phlegm production, or pleurisy, no chest pain or pressure, and no change in bowel or bladder habits. The patient denies fever, rash, bleeding, unexplained fatigue or unexplained weight loss. She did well with her lumpectomy, with no unusual fever, bleeding, or pain A detailed review of systems was otherwise entirely negative.   PAST MEDICAL HISTORY: Past Medical History:  Diagnosis Date  . Hypothyroidism   . PONV (postoperative nausea and vomiting)    after 2nd C-section    PAST SURGICAL HISTORY: Past Surgical History:  Procedure Laterality Date  . CESAREAN SECTION CLASSICAL     X 2  . RADIOACTIVE SEED GUIDED EXCISIONAL BREAST BIOPSY Left 05/06/2016   Procedure: LEFT BREAST RADIOACTIVE SEED GUIDED EXCISIONAL BIOPSY;  Surgeon: Emelia Loron, MD;  Location:  SURGERY CENTER;  Service: General;  Laterality: Left;  . wisdom teeth extractions      FAMILY HISTORY No family history on file.  The patient's parents are living, both age 33 as of April 2018. The patient has one brother. She has no sisters. There is no history of breast or ovarian cancer in the family  GYNECOLOGIC HISTORY:  No LMP recorded. Menarche age 23, first live birth age 4, the patient is GX P2. She still having regular menstrual periods, currently lasting 2-3 days, of which the first 2 days are heavy.  SOCIAL HISTORY:  She is a Futures trader. Her husband Donna Patel works in IT son Donna Post, 37 years old, works in Orthoptist. Son Donna Patel [place], [place], will be attending [employer] in the fall of 2018. The patient is a International aid/development worker    ADVANCED DIRECTIVES: Not in place   HEALTH MAINTENANCE: Social History  Substance Use Topics  . Smoking status: Former Smoker    Types: Cigarettes  . Smokeless tobacco: Never Used     Comment: Quit smoking 22  years ago  . Alcohol use Yes     Comment: Drinks 4 oz of wine each night before bed     Colonoscopy:  PAP:  Bone density:   No Known Allergies  Current  Outpatient Prescriptions  Medication Sig Dispense Refill  . levothyroxine (SYNTHROID, LEVOTHROID) 100 MCG tablet Take 100 mcg by mouth daily before breakfast.     No current facility-administered medications for this visit.     OBJECTIVE: Middle-aged white woman in no acute distress Vitals:   06/30/16 1610  BP: 131/77  Pulse: 84  Resp: 18  Temp: 98.3 F (36.8 C)     Body mass index is 21.81 kg/m.    ECOG FS:0 - Asymptomatic  Ocular: Sclerae unicteric, pupils round and equal Ear-nose-throat: Oropharynx clear, slightly dry Lymphatic: No cervical or supraclavicular adenopathy Lungs no rales or rhonchi, good excursion bilaterally Heart regular rate and rhythm, no murmur appreciated Abd soft, nontender, positive bowel sounds MSK no focal spinal tenderness, no joint edema Neuro: non-focal, well-oriented, appropriate affect Breasts: The right breast is unremarkable. The left breast is status post recent lumpectomy. The incision is healing nicely, with no dehiscence, swelling, or erythema. The cosmetic result is excellent. Both axillae are benign.   LAB RESULTS:  CMP  No results found for: NA, K, CL, CO2, GLUCOSE, BUN, CREATININE, CALCIUM, PROT, ALBUMIN, AST, ALT, ALKPHOS, BILITOT, GFRNONAA, GFRAA  No results found for: TOTALPROTELP, ALBUMINELP, A1GS, A2GS, BETS, BETA2SER, GAMS, MSPIKE, SPEI  No results found for: KPAFRELGTCHN, LAMBDASER, KAPLAMBRATIO  No results found for: WBC, NEUTROABS, HGB, HCT, MCV, PLT    Chemistry   No results found for: NA, K, CL, CO2, BUN, CREATININE, GLU No results found for: CALCIUM, ALKPHOS, AST, ALT, BILITOT     No results found for: LABCA2  No components found for: [id number]  No results for input(s): INR in the last 168 hours.  Urinalysis No results found for: COLORURINE, APPEARANCEUR, LABSPEC, PHURINE, GLUCOSEU, HGBUR, BILIRUBINUR, KETONESUR, PROTEINUR, UROBILINOGEN, NITRITE, LEUKOCYTESUR   STUDIES: Mammographic history  reviewed ELIGIBLE FOR AVAILABLE RESEARCH PROTOCOL: no  ASSESSMENT: 46 y.o. [place], [place] woman Status post left breast upper outer lumpectomy 05/06/2016 for atypical ductal hyperplasia and atypical lobular hyperplasia.  PLAN: We spent the better part of today's 50-minute appointment discussing the biology of breast cancer in general, and the specifics of atypical ductal hyperplasia [ADH] more specifically. Patel understands ADH means, first, relatively unrestricted growth of breast cells and, in addition, some morphologically different features from simple hyperplasia. Atypical ductal hyperplasia can be difficult to tell apart ductal cancer in situ. For those reasons the ADH lesionneeds to be removed.  ADH is also a marker of breast cancer risk. The risk of developing breast cancer in patients with ADH f approaches 1% per year. The new cancer can develop in either breast. One half of those tumors would be invasive.  We also discussed atypical lobular hyperplasia, which is less well established as a precursor lesion, but equally indicates an elevated risk of breast cancer.   Working from the patient's age to the average survival of women today in the [place] I think Donna Patel can expect to live an additional 35-40 years. Using the high end of the risk spectrum for purposes of discussion, this would give her a 30-40% chance of developing breast cancer in her lifetime. This is approximately 3 times the normal risk.  Donna Patel has essentially two ways of lowering that risk. One of them is bilateral mastectomies. This is not recommended for this indication  and I do not believe it would be covered by insurance even if she wanted it which she doesn't. The other, more reasonable approach would be to take anti-estrogens. This could be tamoxifen, raloxifene/Evista, or an aromatase inhibitor  We then discussed the possible toxicities, side effects and complications of the tamoxifen or raloxifene group as opposed to the  aromatase inhibitors. All information was given to [facility] in writing. Any of those agents if taken for 5 years would essentially cut the risk of developing a new breast cancer in half.  We also discussed intensified screening. This would be adding yearly MRI to yearly mammography with tomography. This approach greatly increases sensitivity. Any breast cancer that may develop should be found at the earliest possible stage. However intensified screening has not been documented to improve survival in this population. There are issues regarding cost even if insurance coverage is obtained. There also concerns regarding false positives especially in the first or second year of MRI screening. Similarly we do not have data that mammography every 6 months results in improved survival as compared to every 12 months.  Lanaysia has a good understanding of all these options. She clearly understands she needs to have yearly mammography with tomography and a yearly physician breast exam.   As for the anti-estrogen options just discussed, she could not decide whether she wanted to pursue them further or not. If she decides to try anti-estrogens she will let me know, I will be glad to prescribe it for her, and I would see her after a few months to make sure she is tolerating it well.   Otherwise I do not feel further follow-up here is indicated so no return appointment has been scheduled. I will be available to [facility] in the future if any further questions or concerns regarding breast cancer were to develop.  Lowella Dell, MD   06/30/2016 6:13 PM Medical Oncology and Hematology [facility] [address] [place], [place] [address] Tel. [phone]    Fax. [phone]

## 2017-01-07 ENCOUNTER — Other Ambulatory Visit: Payer: Self-pay | Admitting: Family Medicine

## 2017-01-07 ENCOUNTER — Other Ambulatory Visit (HOSPITAL_COMMUNITY)
Admission: RE | Admit: 2017-01-07 | Discharge: 2017-01-07 | Disposition: A | Discharge status: Home / Self Care | Payer: No Typology Code available for payment source | Source: Ambulatory Visit | Attending: Family Medicine | Admitting: Family Medicine

## 2017-01-07 DIAGNOSIS — Z01411 Encounter for gynecological examination (general) (routine) with abnormal findings: Secondary | ICD-10-CM | POA: Insufficient documentation

## 2017-01-13 LAB — CYTOLOGY - PAP
Diagnosis: NEGATIVE
HPV: NOT DETECTED

## 2017-08-31 ENCOUNTER — Other Ambulatory Visit: Payer: Self-pay | Admitting: Family Medicine

## 2017-08-31 DIAGNOSIS — Z1231 Encounter for screening mammogram for malignant neoplasm of breast: Secondary | ICD-10-CM

## 2017-09-02 ENCOUNTER — Ambulatory Visit
Admission: RE | Admit: 2017-09-02 | Discharge: 2017-09-02 | Disposition: A | Discharge status: Home / Self Care | Payer: PRIVATE HEALTH INSURANCE | Source: Ambulatory Visit | Attending: Family Medicine | Admitting: Family Medicine

## 2017-09-02 DIAGNOSIS — Z1231 Encounter for screening mammogram for malignant neoplasm of breast: Secondary | ICD-10-CM

## 2018-07-28 ENCOUNTER — Other Ambulatory Visit: Payer: Self-pay | Admitting: Family Medicine

## 2018-07-28 DIAGNOSIS — Z1231 Encounter for screening mammogram for malignant neoplasm of breast: Secondary | ICD-10-CM

## 2018-09-13 ENCOUNTER — Other Ambulatory Visit: Payer: Self-pay

## 2018-09-13 ENCOUNTER — Ambulatory Visit
Admission: RE | Admit: 2018-09-13 | Discharge: 2018-09-13 | Disposition: A | Discharge status: Home / Self Care | Payer: PRIVATE HEALTH INSURANCE | Source: Ambulatory Visit | Attending: Family Medicine | Admitting: Family Medicine

## 2018-09-13 DIAGNOSIS — Z1231 Encounter for screening mammogram for malignant neoplasm of breast: Secondary | ICD-10-CM

## 2019-08-15 ENCOUNTER — Other Ambulatory Visit: Payer: Self-pay | Admitting: Family Medicine

## 2019-08-15 DIAGNOSIS — Z1231 Encounter for screening mammogram for malignant neoplasm of breast: Secondary | ICD-10-CM

## 2019-09-14 ENCOUNTER — Ambulatory Visit
Admission: RE | Admit: 2019-09-14 | Discharge: 2019-09-14 | Disposition: A | Discharge status: Home / Self Care | Payer: PRIVATE HEALTH INSURANCE | Source: Ambulatory Visit | Attending: Family Medicine | Admitting: Family Medicine

## 2019-09-14 DIAGNOSIS — Z1231 Encounter for screening mammogram for malignant neoplasm of breast: Secondary | ICD-10-CM

## 2020-08-10 ENCOUNTER — Other Ambulatory Visit: Payer: Self-pay | Admitting: Family Medicine

## 2020-08-10 DIAGNOSIS — Z1231 Encounter for screening mammogram for malignant neoplasm of breast: Secondary | ICD-10-CM

## 2020-10-03 ENCOUNTER — Inpatient Hospital Stay: Admission: RE | Admit: 2020-10-03 | Payer: PRIVATE HEALTH INSURANCE | Source: Ambulatory Visit

## 2020-10-08 ENCOUNTER — Ambulatory Visit
Admission: RE | Admit: 2020-10-08 | Discharge: 2020-10-08 | Disposition: A | Discharge status: Home / Self Care | Payer: No Typology Code available for payment source | Source: Ambulatory Visit | Attending: Family Medicine | Admitting: Family Medicine

## 2020-10-08 ENCOUNTER — Other Ambulatory Visit: Payer: Self-pay

## 2020-10-08 DIAGNOSIS — Z1231 Encounter for screening mammogram for malignant neoplasm of breast: Secondary | ICD-10-CM

## 2021-04-05 ENCOUNTER — Other Ambulatory Visit: Payer: Self-pay | Admitting: Family Medicine

## 2021-04-05 ENCOUNTER — Other Ambulatory Visit (HOSPITAL_COMMUNITY)
Admission: RE | Admit: 2021-04-05 | Discharge: 2021-04-05 | Disposition: A | Discharge status: Home / Self Care | Payer: No Typology Code available for payment source | Source: Ambulatory Visit | Attending: Family Medicine | Admitting: Family Medicine

## 2021-04-05 DIAGNOSIS — Z01419 Encounter for gynecological examination (general) (routine) without abnormal findings: Secondary | ICD-10-CM | POA: Diagnosis present

## 2021-04-08 LAB — CYTOLOGY - PAP
Comment: NEGATIVE
Diagnosis: NEGATIVE
High risk HPV: NEGATIVE

## 2021-08-28 ENCOUNTER — Other Ambulatory Visit: Payer: Self-pay | Admitting: Family Medicine

## 2021-08-28 DIAGNOSIS — Z1231 Encounter for screening mammogram for malignant neoplasm of breast: Secondary | ICD-10-CM

## 2021-10-10 ENCOUNTER — Ambulatory Visit
Admission: RE | Admit: 2021-10-10 | Discharge: 2021-10-10 | Disposition: A | Discharge status: Home / Self Care | Payer: No Typology Code available for payment source | Source: Ambulatory Visit | Attending: Family Medicine | Admitting: Family Medicine

## 2021-10-10 DIAGNOSIS — Z1231 Encounter for screening mammogram for malignant neoplasm of breast: Secondary | ICD-10-CM

## 2021-12-03 IMAGING — MG MM DIGITAL SCREENING BILAT W/ TOMO AND CAD
8 series · 9 of 24 positions shown · non-contrast
Comparison: Previous exam(s).

CLINICAL DATA: Screening.

EXAM:
DIGITAL SCREENING BILATERAL MAMMOGRAM WITH TOMOSYNTHESIS AND CAD
TECHNIQUE: Bilateral screening digital craniocaudal and mediolateral oblique
mammograms were obtained. Bilateral screening digital breast
tomosynthesis was performed. The images were evaluated with
computer-aided detection.

[R CC synth-2D]
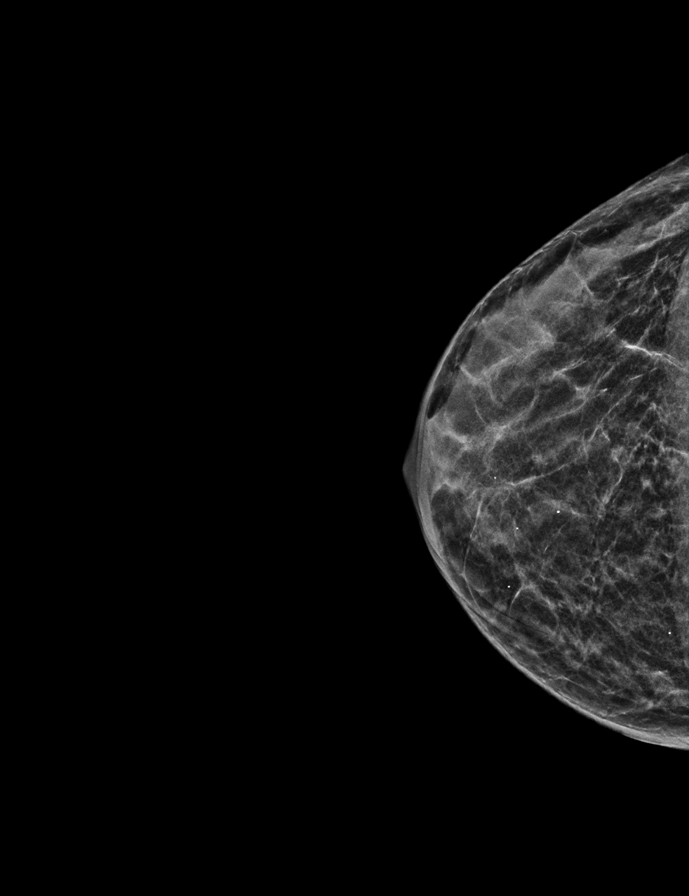

[L CC synth-2D]
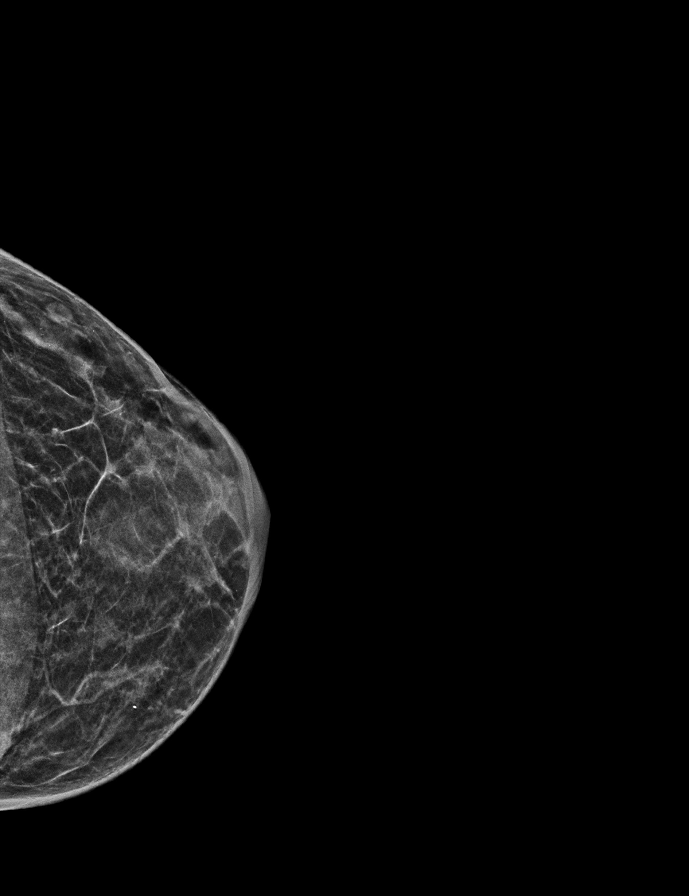

[R MLO synth-2D]
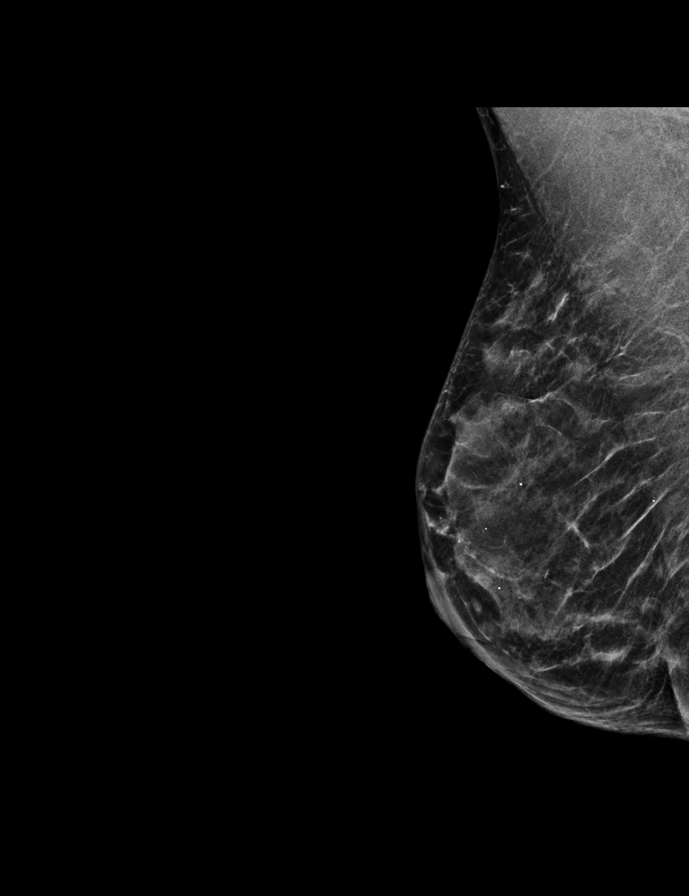

[L MLO synth-2D]
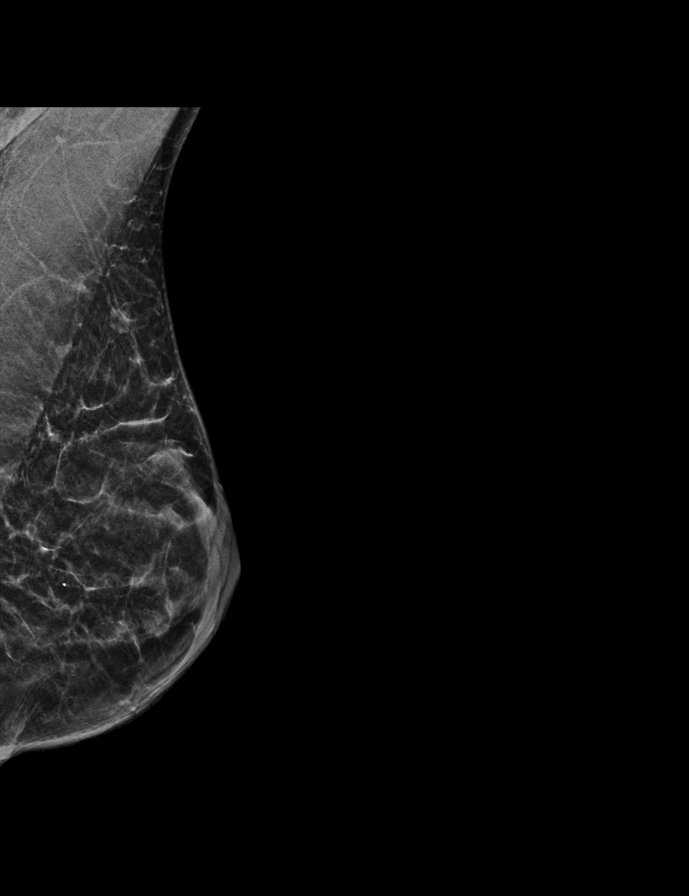

[R CC tomo · 2 of 33 frames shown]
[frame 11/33]
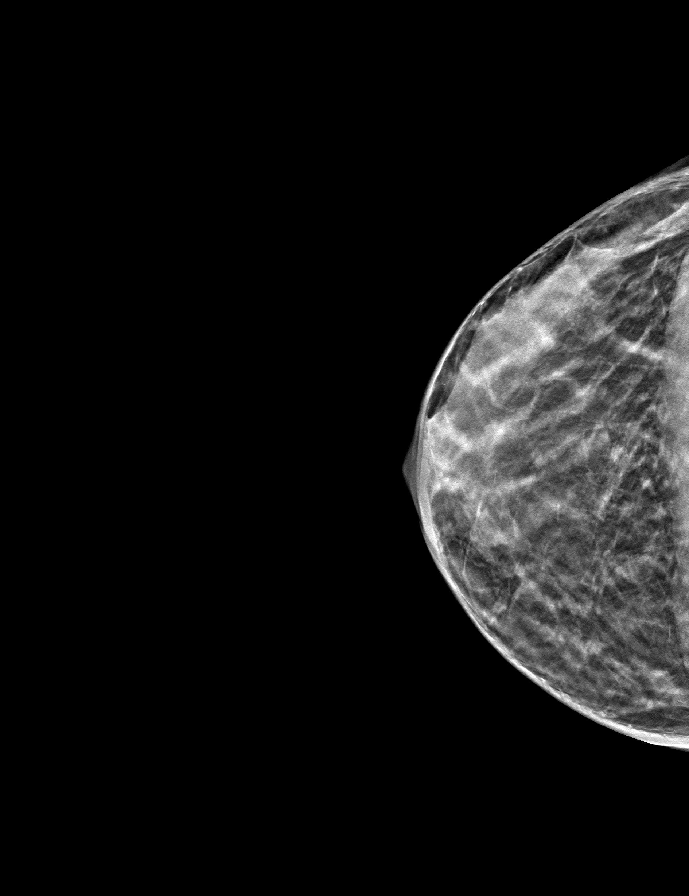
[frame 17/33]
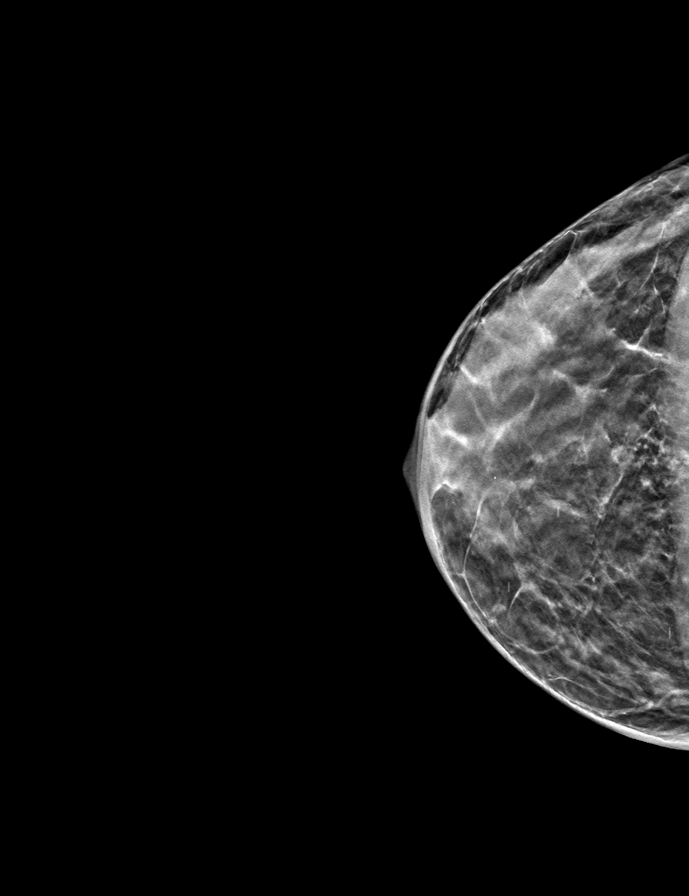

[R MLO tomo · tomo slice 19/36.0]
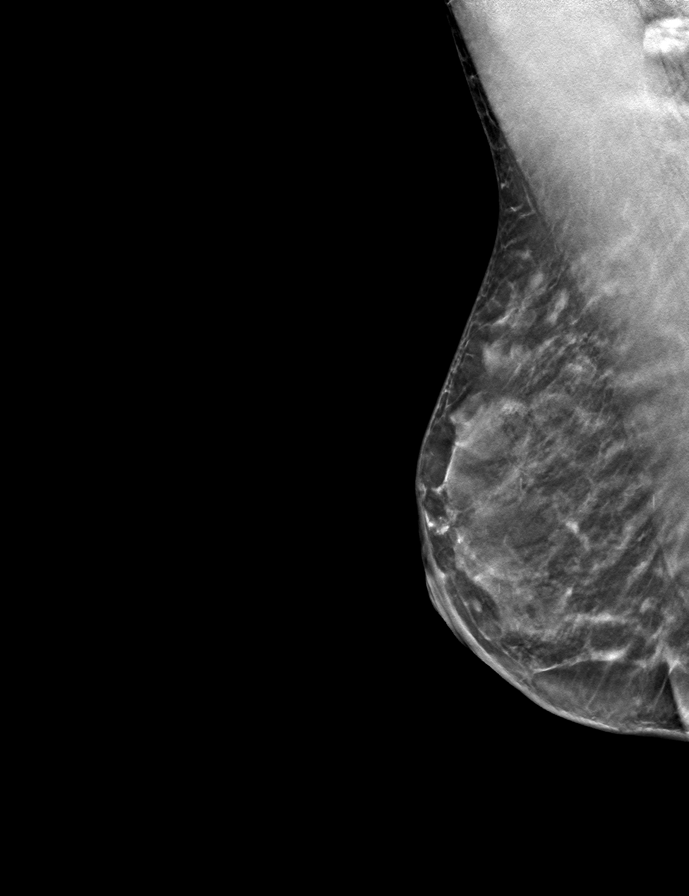

[L MLO tomo · tomo slice 19/36.0]
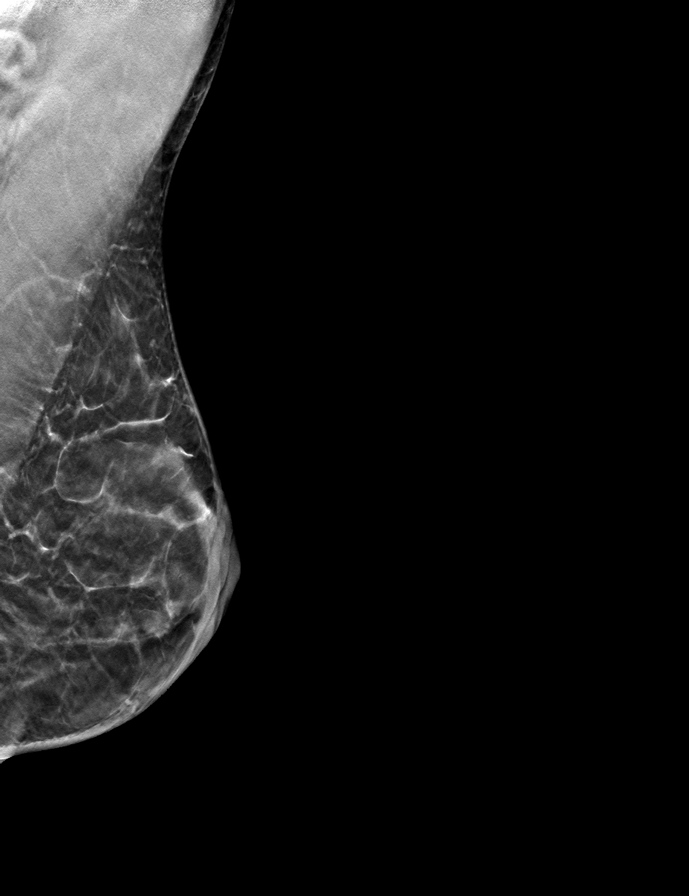

[L CC tomo · tomo slice 16/31.0]
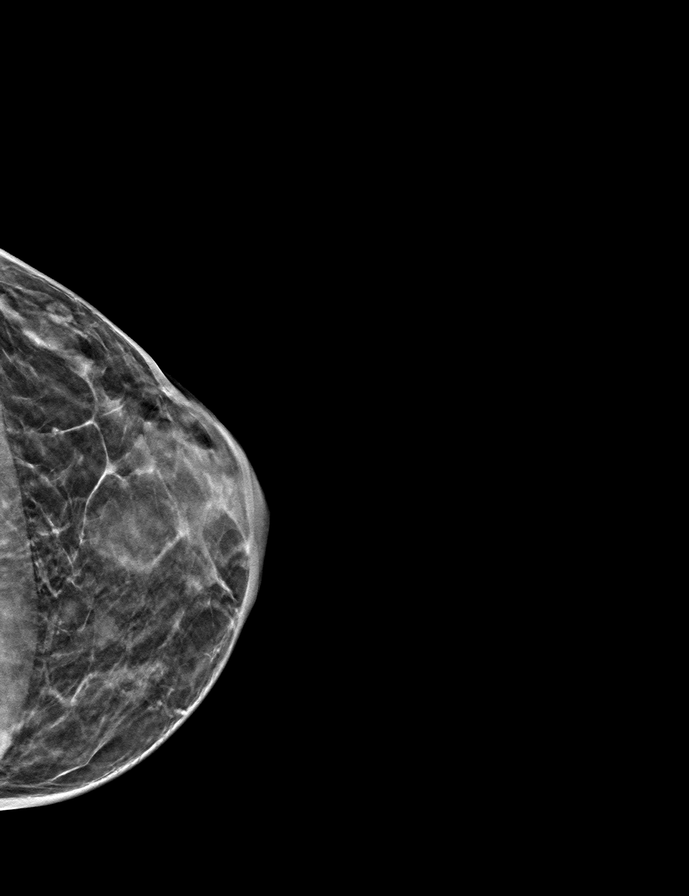

[9 of 24 positions shown; findings below may reference images not displayed]

ACR Breast Density Category c: The breast tissue is heterogeneously
dense, which may obscure small masses.
FINDINGS: There are no findings suspicious for malignancy.
IMPRESSION: No mammographic evidence of malignancy. A result letter of this
screening mammogram will be mailed directly to the patient.

RECOMMENDATION:
Screening mammogram in one year. (Code:Q3-W-BC3)

BI-RADS CATEGORY  1: Negative.

## 2022-09-01 ENCOUNTER — Other Ambulatory Visit: Payer: Self-pay | Admitting: Family Medicine

## 2022-09-01 DIAGNOSIS — Z1231 Encounter for screening mammogram for malignant neoplasm of breast: Secondary | ICD-10-CM

## 2022-10-13 ENCOUNTER — Ambulatory Visit
Admission: RE | Admit: 2022-10-13 | Discharge: 2022-10-13 | Disposition: A | Discharge status: Home / Self Care | Payer: No Typology Code available for payment source | Source: Ambulatory Visit | Attending: Family Medicine | Admitting: Family Medicine

## 2022-10-13 DIAGNOSIS — Z1231 Encounter for screening mammogram for malignant neoplasm of breast: Secondary | ICD-10-CM

## 2023-09-23 ENCOUNTER — Other Ambulatory Visit: Payer: Self-pay | Admitting: Family Medicine

## 2023-09-23 DIAGNOSIS — Z1231 Encounter for screening mammogram for malignant neoplasm of breast: Secondary | ICD-10-CM

## 2023-10-14 ENCOUNTER — Ambulatory Visit
Admission: RE | Admit: 2023-10-14 | Discharge: 2023-10-14 | Disposition: A | Discharge status: Home / Self Care | Source: Ambulatory Visit | Attending: Family Medicine | Admitting: Family Medicine

## 2023-10-14 DIAGNOSIS — Z1231 Encounter for screening mammogram for malignant neoplasm of breast: Secondary | ICD-10-CM
